# Patient Record
Sex: Male | Born: 1970 | Race: Black or African American | Hispanic: No | Marital: Single | Smoking: Current every day smoker
Health system: Southern US, Community
[De-identification: ages and names within clinical notes are randomized; demographics above are authoritative.]

---

## 2003-11-07 ENCOUNTER — Emergency Department (HOSPITAL_COMMUNITY): Admission: EM | Admit: 2003-11-07 | Discharge: 2003-11-07 | Payer: Self-pay | Admitting: Emergency Medicine

## 2005-09-20 ENCOUNTER — Emergency Department (HOSPITAL_COMMUNITY): Admission: EM | Admit: 2005-09-20 | Discharge: 2005-09-20 | Payer: Self-pay | Admitting: Emergency Medicine

## 2007-04-08 ENCOUNTER — Emergency Department (HOSPITAL_COMMUNITY): Admission: EM | Admit: 2007-04-08 | Discharge: 2007-04-08 | Payer: Self-pay | Admitting: Emergency Medicine

## 2007-04-11 ENCOUNTER — Emergency Department (HOSPITAL_COMMUNITY): Admission: EM | Admit: 2007-04-11 | Discharge: 2007-04-11 | Payer: Self-pay | Admitting: Emergency Medicine

## 2007-04-12 ENCOUNTER — Emergency Department (HOSPITAL_COMMUNITY): Admission: EM | Admit: 2007-04-12 | Discharge: 2007-04-12 | Payer: Self-pay | Admitting: Emergency Medicine

## 2007-04-15 ENCOUNTER — Emergency Department (HOSPITAL_COMMUNITY): Admission: EM | Admit: 2007-04-15 | Discharge: 2007-04-15 | Payer: Self-pay | Admitting: Emergency Medicine

## 2007-08-30 IMAGING — CR DG LUMBAR SPINE COMPLETE 4+V
6 series · 6 of 6 positions shown · non-contrast
Comparison: none

CLINICAL DATA: Back pain.
 LUMBAR SPINE ? 4 VIEWS:

[view not recorded (1 of 6)]
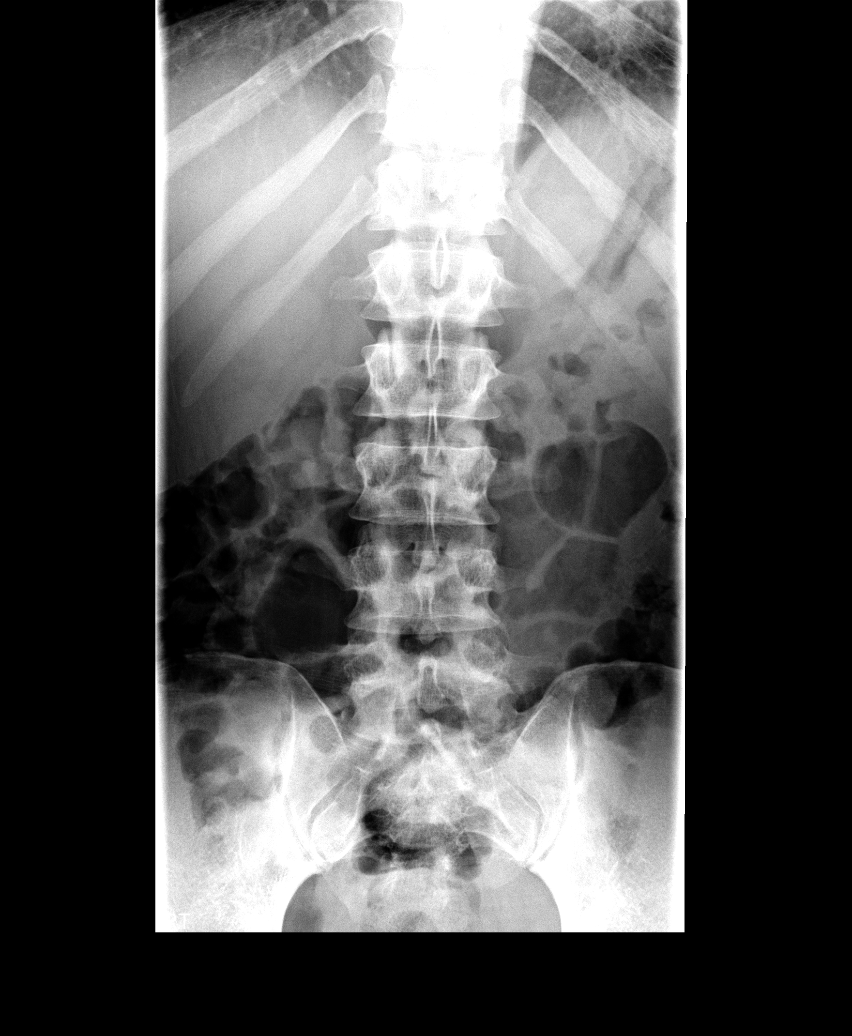

[view not recorded (2 of 6)]
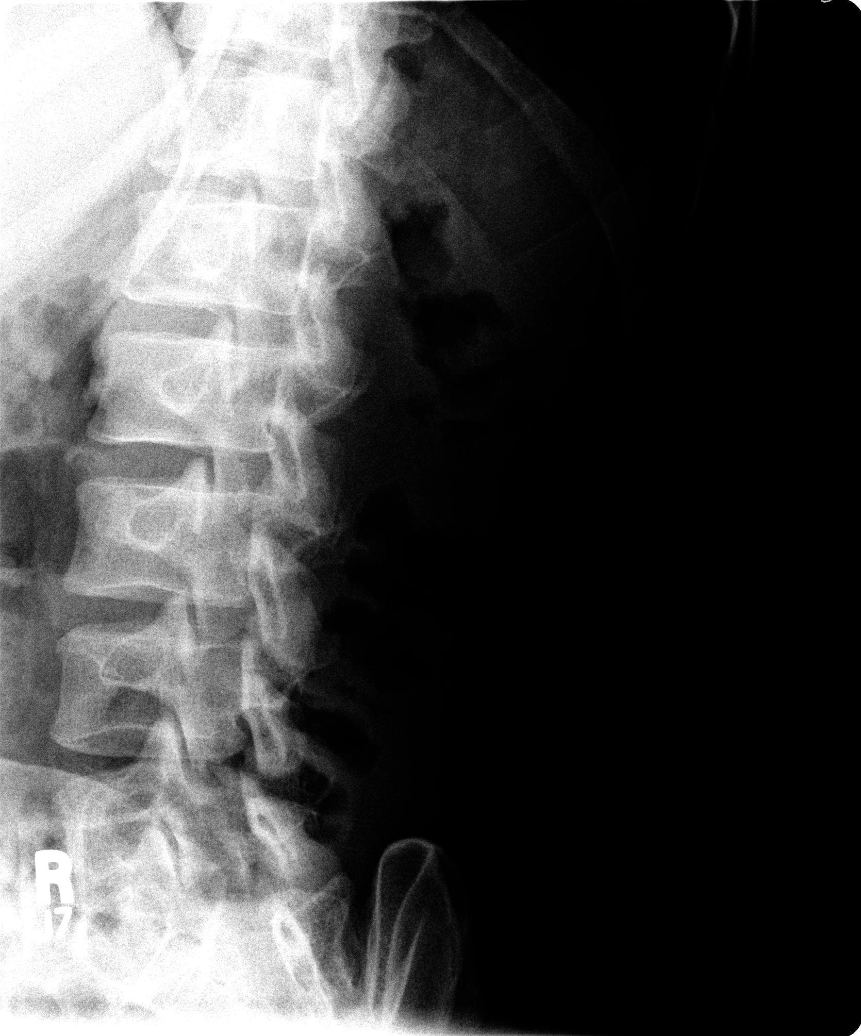

[view not recorded (3 of 6)]
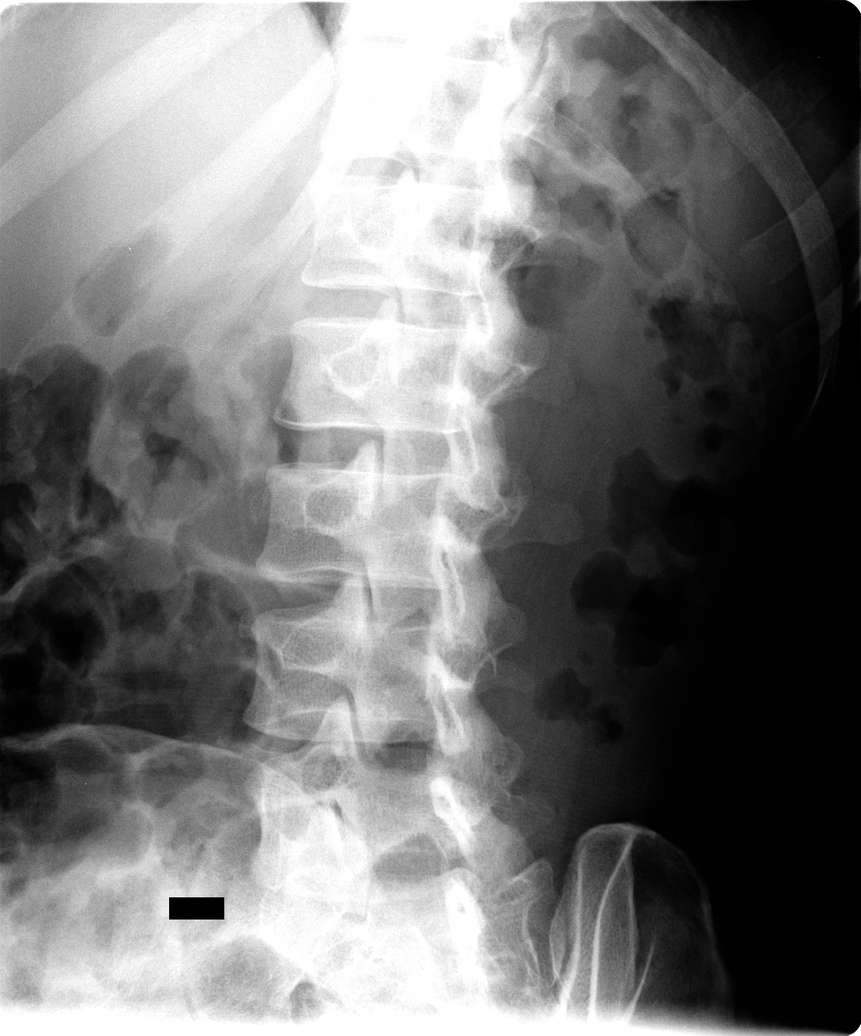

[view not recorded (4 of 6)]
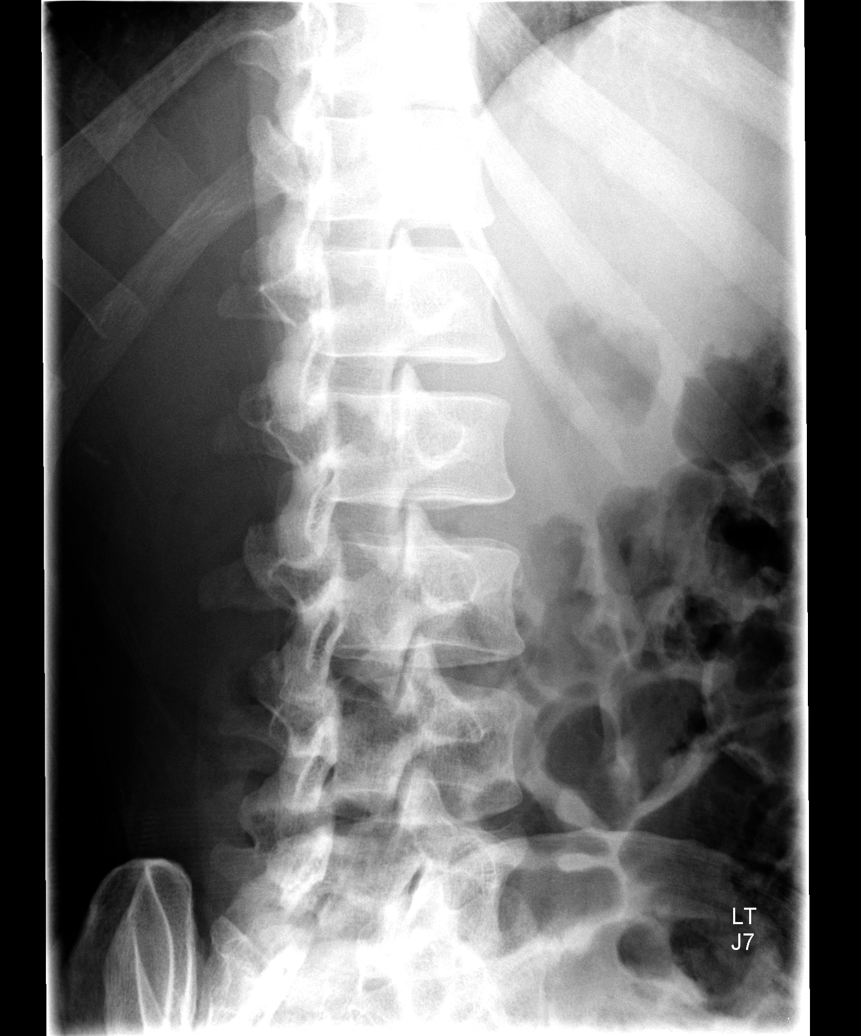

[view not recorded (5 of 6)]
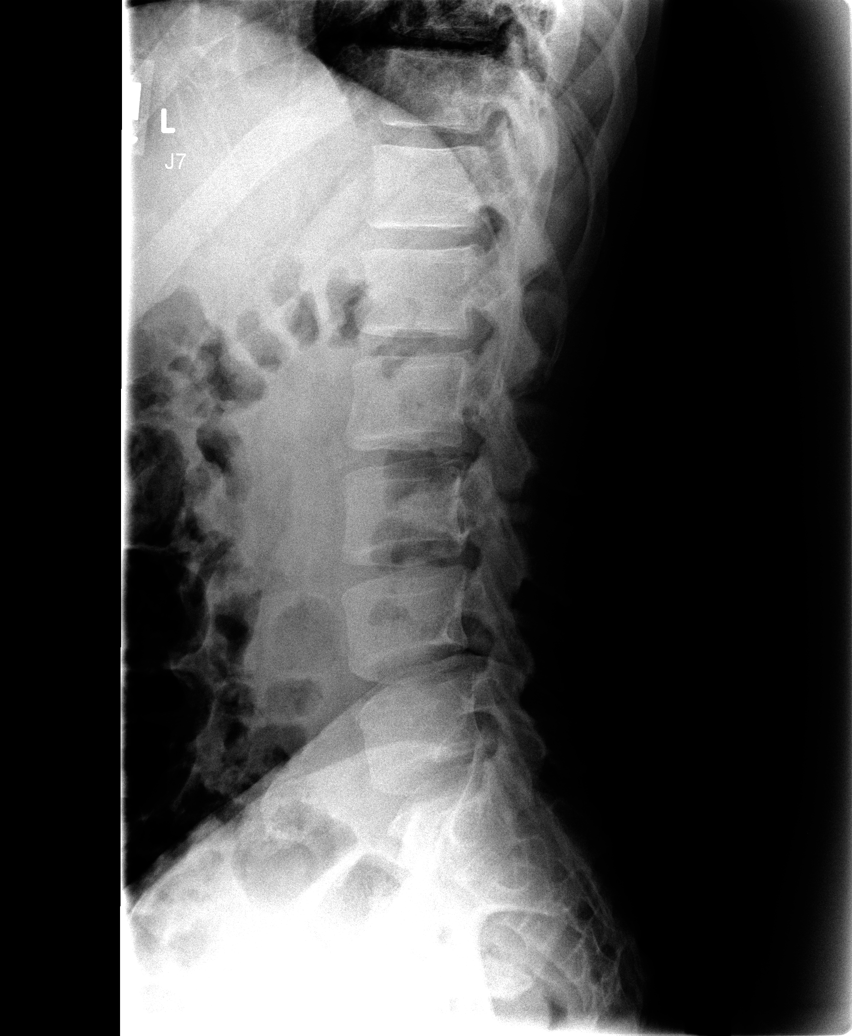

[view not recorded (6 of 6)]
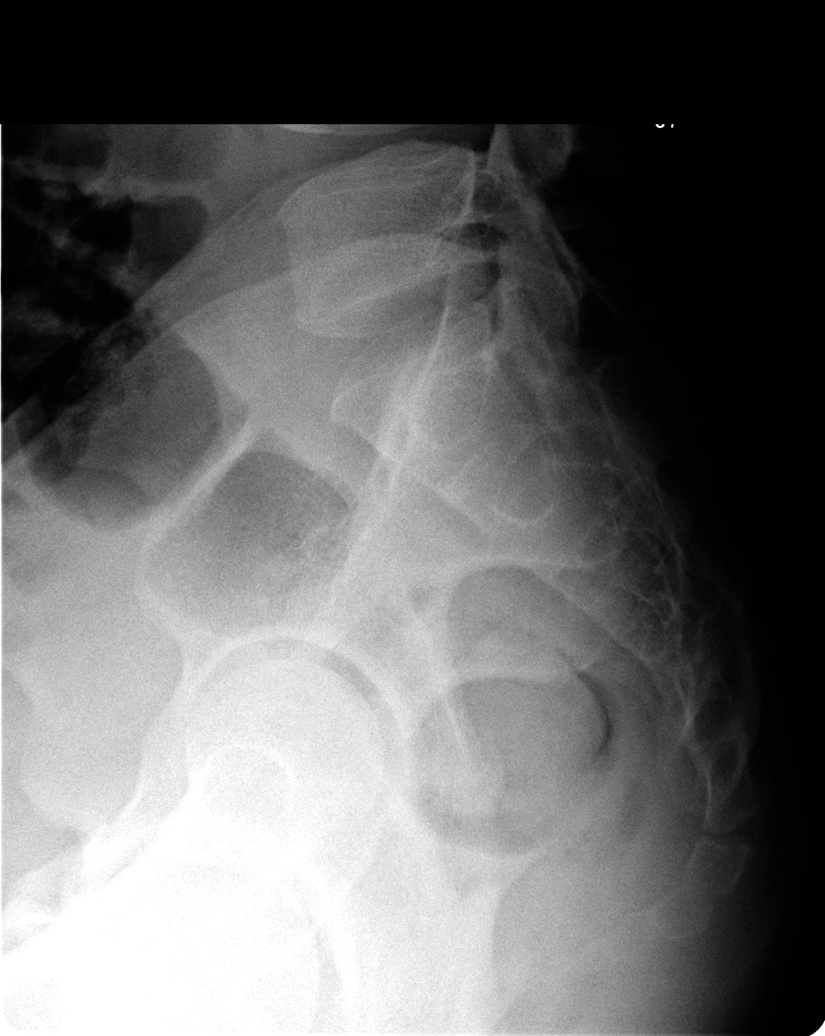

[6 of 6 positions shown; findings below may reference images not displayed]

FINDINGS: The patient has five lumbar type vertebral bodies.  Vertebral body height and alignment are maintained.  Disk space height is also maintained.  No notable facet arthropathy.
IMPRESSION: Negative exam.

## 2008-09-04 ENCOUNTER — Emergency Department (HOSPITAL_COMMUNITY): Admission: EM | Admit: 2008-09-04 | Discharge: 2008-09-04 | Payer: Self-pay | Admitting: Emergency Medicine

## 2014-09-10 ENCOUNTER — Ambulatory Visit: Payer: BLUE CROSS/BLUE SHIELD | Admitting: Neurology

## 2014-09-11 ENCOUNTER — Encounter: Payer: Self-pay | Admitting: Neurology

## 2015-05-16 ENCOUNTER — Emergency Department (HOSPITAL_COMMUNITY)
Admission: EM | Admit: 2015-05-16 | Discharge: 2015-05-16 | Disposition: A | Discharge status: Other Acute Inpatient Hospital | Payer: Self-pay | Attending: Psychiatry | Admitting: Psychiatry

## 2015-05-16 ENCOUNTER — Encounter (HOSPITAL_COMMUNITY): Payer: Self-pay | Admitting: Emergency Medicine

## 2015-05-16 DIAGNOSIS — F17210 Nicotine dependence, cigarettes, uncomplicated: Secondary | ICD-10-CM | POA: Insufficient documentation

## 2015-05-16 DIAGNOSIS — R4689 Other symptoms and signs involving appearance and behavior: Secondary | ICD-10-CM

## 2015-05-16 DIAGNOSIS — R45851 Suicidal ideations: Secondary | ICD-10-CM | POA: Insufficient documentation

## 2015-05-16 DIAGNOSIS — R4182 Altered mental status, unspecified: Secondary | ICD-10-CM | POA: Insufficient documentation

## 2015-05-16 DIAGNOSIS — R4589 Other symptoms and signs involving emotional state: Secondary | ICD-10-CM

## 2015-05-16 LAB — CBC
HEMATOCRIT: 45.9 % (ref 39.0–52.0)
Hemoglobin: 15.6 g/dL (ref 13.0–17.0)
MCH: 31 pg (ref 26.0–34.0)
MCHC: 34 g/dL (ref 30.0–36.0)
MCV: 91.3 fL (ref 78.0–100.0)
Platelets: 168 10*3/uL (ref 150–400)
RBC: 5.03 MIL/uL (ref 4.22–5.81)
RDW: 13.3 % (ref 11.5–15.5)
WBC: 4.5 10*3/uL (ref 4.0–10.5)

## 2015-05-16 LAB — RAPID URINE DRUG SCREEN, HOSP PERFORMED
Amphetamines: NOT DETECTED
BARBITURATES: NOT DETECTED
BENZODIAZEPINES: NOT DETECTED
Cocaine: NOT DETECTED
Opiates: NOT DETECTED
Tetrahydrocannabinol: NOT DETECTED

## 2015-05-16 LAB — COMPREHENSIVE METABOLIC PANEL
ALBUMIN: 4.3 g/dL (ref 3.5–5.0)
ALK PHOS: 50 U/L (ref 38–126)
ALT: 15 U/L — ABNORMAL LOW (ref 17–63)
AST: 19 U/L (ref 15–41)
Anion gap: 8 (ref 5–15)
BILIRUBIN TOTAL: 0.9 mg/dL (ref 0.3–1.2)
BUN: 8 mg/dL (ref 6–20)
CALCIUM: 9.2 mg/dL (ref 8.9–10.3)
CO2: 27 mmol/L (ref 22–32)
CREATININE: 1.01 mg/dL (ref 0.61–1.24)
Chloride: 106 mmol/L (ref 101–111)
GFR calc Af Amer: >60 mL/min (ref >60)
GLUCOSE: 90 mg/dL (ref 65–99)
Potassium: 3.4 mmol/L — ABNORMAL LOW (ref 3.5–5.1)
Sodium: 141 mmol/L (ref 135–145)
TOTAL PROTEIN: 7.3 g/dL (ref 6.5–8.1)

## 2015-05-16 LAB — ACETAMINOPHEN LEVEL: Acetaminophen (Tylenol), Serum: <10 ug/mL — ABNORMAL LOW (ref 10–30)

## 2015-05-16 LAB — ETHANOL: Alcohol, Ethyl (B): 41 mg/dL — ABNORMAL HIGH (ref <5)

## 2015-05-16 LAB — SALICYLATE LEVEL: Salicylate Lvl: <4 mg/dL (ref 2.8–30.0)

## 2015-05-16 NOTE — ED Notes (Signed)
Patient requesting water in order to use the bathroom. Gave patient ice water. Patient states "I don't want it. What you gonna do, stick a tube in my bladder? I don't care, I've been there before." Set water at table beside patient.
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
## 2015-05-16 NOTE — ED Notes (Signed)
Pt refusing lab at this time.

## 2015-05-16 NOTE — ED Notes (Signed)
telepsych in progress 

## 2015-05-16 NOTE — ED Notes (Signed)
Pt reports having to go to work in the morning. Pt became upset when notified of procedures. Pt states, I am not going to stay. States that this is pointless and we are not going to be able to stop him. States he refuses to speak to TTS.

## 2015-05-16 NOTE — ED Notes (Signed)
Per [employer] officer, IVC paperwork en route.

## 2015-05-16 NOTE — ED Notes (Signed)
[facility]. Other [employer] officer at bedside switching out handcuffs and supervision of patient.

## 2015-05-16 NOTE — ED Notes (Signed)
Patient states he is not going to allow us to collect blood and urine. Advised patient of IVC papers. Security, Emergency planning/management officerpolice officer, and phlebotomist at bedside at this time. Patient allowing phlebotomist to draw blood.

## 2015-05-16 NOTE — ED Notes (Signed)
Pt wanded by security. 

## 2015-05-16 NOTE — ED Provider Notes (Signed)
CSN: [id number]     Arrival date & time 05/16/15  1723 History   First MD Initiated Contact with Patient 05/16/15 1759     Chief Complaint  Patient presents with  . V70.1     (Consider location/radiation/quality/duration/timing/severity/associated sxs/prior Treatment) Patient is a 44 y.o. male presenting with altered mental status. The history is provided by the police (The police state that he stop this patient had a stoplight. The patient told him that he wanted to kill himself.).  Altered Mental Status Presenting symptoms: behavior changes   Severity:  Severe Most recent episode:  Today Episode history:  Unable to specify Timing:  Constant Progression:  Worsening Chronicity:  New Context: not alcohol use   Associated symptoms: no abdominal pain, no hallucinations, no headaches, no rash and no seizures     History reviewed. No pertinent past medical history. History reviewed. No pertinent past surgical history. No family history on file. Social History  Substance Use Topics  . Smoking status: Current Every Day Smoker -- 1.00 packs/day    Types: Cigarettes  . Smokeless tobacco: None  . Alcohol Use: Yes    Review of Systems  Constitutional: Negative for appetite change and fatigue.  HENT: Negative for congestion, ear discharge and sinus pressure.   Eyes: Negative for discharge.  Respiratory: Negative for cough.   Cardiovascular: Negative for chest pain.  Gastrointestinal: Negative for abdominal pain and diarrhea.  Genitourinary: Negative for frequency and hematuria.  Musculoskeletal: Negative for back pain.  Skin: Negative for rash.  Neurological: Negative for seizures and headaches.  Psychiatric/Behavioral: Negative for hallucinations.       Suicidal      Allergies  Review of patient's allergies indicates no known allergies.  Home Medications   Prior to Admission medications   Not on File   BP 172/94 mmHg  Pulse 63  Temp(Src) 98 F (36.7 C) (Oral)   Resp 18  Ht 5\' 6"  (1.676 m)  Wt 131 lb (59.421 kg)  BMI 21.15 kg/m2  SpO2 100% Physical Exam  Constitutional: He is oriented to person, place, and time. He appears well-developed.  HENT:  Head: Normocephalic.  Eyes: Conjunctivae and EOM are normal. No scleral icterus.  Neck: Neck supple. No thyromegaly present.  Cardiovascular: Normal rate and regular rhythm.  Exam reveals no gallop and no friction rub.   No murmur heard. Pulmonary/Chest: No stridor. He has no wheezes. He has no rales. He exhibits no tenderness.  Abdominal: He exhibits no distension. There is no tenderness. There is no rebound.  Musculoskeletal: Normal range of motion. He exhibits no edema.  Lymphadenopathy:    He has no cervical adenopathy.  Neurological: He is oriented to person, place, and time. He exhibits normal muscle tone. Coordination normal.  Skin: No rash noted. No erythema.  Psychiatric:  Suicidal    ED Course  Procedures (including critical care time) Labs Review Labs Reviewed  COMPREHENSIVE METABOLIC PANEL - Abnormal; Notable for the following:    Potassium 3.4 (*)    ALT 15 (*)    All other components within normal limits  ETHANOL - Abnormal; Notable for the following:    Alcohol, Ethyl (B) 41 (*)    All other components within normal limits  ACETAMINOPHEN LEVEL - Abnormal; Notable for the following:    Acetaminophen (Tylenol), Serum <10 (*)    All other components within normal limits  SALICYLATE LEVEL  CBC  URINE RAPID DRUG SCREEN, HOSP PERFORMED    Imaging Review No results found. I have  personally reviewed and evaluated these images and lab results as part of my medical decision-making.   EKG Interpretation None      MDM   Final diagnoses:  Suicidal behavior    Patient will be admitted to behavior health for suicidal ideations    Bethann Berkshire, MD 05/16/15 2107

## 2015-05-16 NOTE — ED Notes (Signed)
[employer] officer contacted contacts and IVC paperwork has not been started. ED staff initiating  IVC paperwork.

## 2015-05-16 NOTE — ED Notes (Signed)
Went in to assess patient. Pt looking at television and refusing to answer assessment questions. Right wrist handcuffed to stretcher.
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
## 2015-05-17 ENCOUNTER — Encounter (HOSPITAL_COMMUNITY): Payer: Self-pay | Admitting: Emergency Medicine

## 2015-05-17 ENCOUNTER — Inpatient Hospital Stay (HOSPITAL_COMMUNITY)
Admission: AD | Admit: 2015-05-17 | Discharge: 2015-05-21 | DRG: 885 | Disposition: A | Discharge status: Home / Self Care | Payer: No Typology Code available for payment source | Source: Intra-hospital | Attending: Psychiatry | Admitting: Psychiatry

## 2015-05-17 DIAGNOSIS — F419 Anxiety disorder, unspecified: Secondary | ICD-10-CM | POA: Diagnosis present

## 2015-05-17 DIAGNOSIS — E876 Hypokalemia: Secondary | ICD-10-CM | POA: Diagnosis present

## 2015-05-17 DIAGNOSIS — G4700 Insomnia, unspecified: Secondary | ICD-10-CM | POA: Diagnosis present

## 2015-05-17 DIAGNOSIS — F1010 Alcohol abuse, uncomplicated: Secondary | ICD-10-CM | POA: Diagnosis present

## 2015-05-17 DIAGNOSIS — F17210 Nicotine dependence, cigarettes, uncomplicated: Secondary | ICD-10-CM | POA: Diagnosis present

## 2015-05-17 DIAGNOSIS — F17200 Nicotine dependence, unspecified, uncomplicated: Secondary | ICD-10-CM | POA: Clinically undetermined

## 2015-05-17 DIAGNOSIS — F331 Major depressive disorder, recurrent, moderate: Principal | ICD-10-CM | POA: Clinically undetermined

## 2015-05-17 DIAGNOSIS — F630 Pathological gambling: Secondary | ICD-10-CM | POA: Diagnosis present

## 2015-05-17 DIAGNOSIS — R45851 Suicidal ideations: Secondary | ICD-10-CM | POA: Diagnosis present

## 2015-05-17 DIAGNOSIS — F339 Major depressive disorder, recurrent, unspecified: Secondary | ICD-10-CM | POA: Diagnosis present

## 2015-05-17 MED ORDER — MAGNESIUM HYDROXIDE 400 MG/5ML PO SUSP: 30.0000 mL | Freq: Every day | ORAL | Status: DC | PRN

## 2015-05-17 MED ORDER — ACETAMINOPHEN 325 MG PO TABS: 650.0000 mg | ORAL_TABLET | Freq: Four times a day (QID) | ORAL | Status: DC | PRN

## 2015-05-17 MED ORDER — HYDROXYZINE HCL 25 MG PO TABS
25.0000 mg | ORAL_TABLET | Freq: Three times a day (TID) | ORAL | Status: DC | PRN
  Filled 2015-05-17: qty 10

## 2015-05-17 MED ORDER — ALUM & MAG HYDROXIDE-SIMETH 200-200-20 MG/5ML PO SUSP: 30.0000 mL | ORAL | Status: DC | PRN

## 2015-05-17 MED ORDER — CITALOPRAM HYDROBROMIDE 10 MG PO TABS
10.0000 mg | ORAL_TABLET | Freq: Every day | ORAL | Status: DC
  Administered 2015-05-17 – 2015-05-21 (×5): 10 mg via ORAL
  Filled 2015-05-17 (×2): qty 1
  Filled 2015-05-17: qty 7
  Filled 2015-05-17 (×5): qty 1

## 2015-05-17 MED ORDER — POTASSIUM CHLORIDE CRYS ER 10 MEQ PO TBCR
10.0000 meq | EXTENDED_RELEASE_TABLET | Freq: Two times a day (BID) | ORAL | Status: AC
  Administered 2015-05-17 – 2015-05-19 (×4): 10 meq via ORAL
  Filled 2015-05-17 (×6): qty 1

## 2015-05-17 MED ORDER — LORAZEPAM 1 MG PO TABS: 1.0000 mg | ORAL_TABLET | ORAL | Status: DC | PRN

## 2015-05-17 NOTE — Progress Notes (Signed)
D: Pt denies SI/HI/AVH. Pt is pleasant and cooperative. Pt irritated, pt stayed in room, pt refused anythig to help relax him. Pt forwards little information. "I just said the wrong fucking thing to the wrong fucking person".   A: Pt was offered support and encouragement. Pt was encourage to attend groups. Q 15 minute checks were done for safety.   R: safety maintained on unit.

## 2015-05-17 NOTE — Progress Notes (Signed)
Patient has been isolative to room this shift.  Patient has been irritable when approached by staff and asked to be left alone.  Patient has been hesitant to be compliant with medications and has reported feeling depressed and having thoughts of suicide but no active plan in the hospital.  Assess patient for safety, offer medications as prescribed, engage patient in 1:1 staff talks,   Patient was able to contract for safety.

## 2015-05-17 NOTE — [facility] Group Notes (Signed)
[facility] Group Notes: (Clinical Social Work)   05/17/2015      Type of Therapy:  Group Therapy   Participation Level:  Did Not Attend despite MHT prompting   Ambrose MantleMareida Grossman-Orr, LCSW 05/17/2015, 12:32 PM
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
## 2015-05-17 NOTE — [facility] Suicide Risk Assessment (Addendum)
[facility]   Nursing information obtained from:    Demographic factors:    Current Mental Status:    Loss Factors:    Historical Factors:    Risk Reduction Factors:    Total Time spent with patient: 30 minutes Principal Problem: MDD (major depressive disorder), recurrent episode, moderate (HCC) Diagnosis:   Patient Active Problem List   Diagnosis Date Noted  . MDD (major depressive disorder), recurrent episode, moderate (HCC) [F33.1] 05/17/2015  . Alcohol use disorder, mild, abuse [F10.10] 05/17/2015  . Tobacco use disorder [F17.200] 05/17/2015  . Hypokalemia [E87.6] 05/17/2015     Continued Clinical Symptoms:  Alcohol Use Disorder Identification Test Final Score (AUDIT): 1 The "Alcohol Use Disorders Identification Test", Guidelines for Use in Primary Care, Second Edition.  World Science writerHealth Organization [facility](WHO). Score between 0-7:  no or low risk or alcohol related problems. Score between 8-15:  moderate risk of alcohol related problems. Score between 16-19:  high risk of alcohol related problems. Score 20 or above:  warrants further diagnostic evaluation for alcohol dependence and treatment.   CLINICAL FACTORS:   Alcohol/Substance Abuse/Dependencies   Musculoskeletal: Strength & Muscle Tone: within normal limits Gait & Station: normal Patient leans: N/A  Psychiatric Specialty Exam: Physical Exam  Review of Systems  Psychiatric/Behavioral: Positive for depression and substance abuse. The patient is nervous/anxious.   All other systems reviewed and are negative.   Blood pressure 127/79, pulse 54, temperature 97.9 F (36.6 C), temperature source Oral, resp. rate 18, height 5\' 7"  (1.702 m), weight 61.689 kg (136 lb).Body mass index is 21.3 kg/(m^2).  General Appearance: Fairly Groomed  Patent attorneyye Contact::  Poor  Speech:  Clear and Coherent  Volume:  Normal  Mood:  Anxious and Depressed  Affect:  Congruent  Thought Process:  Coherent  Orientation:   Full (Time, Place, and Person)  Thought Content:  Rumination  Suicidal Thoughts:  No  Homicidal Thoughts:  No  Memory:  Immediate;   Fair Recent;   Fair Remote;   Fair  Judgement:  Impaired  Insight:  Fair  Psychomotor Activity:  Normal  Concentration:  Fair  Recall:  [employer]is[employer]Fair  Fund of Knowledge:Fair  Language: Fair  Akathisia:  No  Handed:  Right  AIMS (if indicated):     Assets:  Communication Skills Desire for Improvement  Sleep:     Cognition: WNL  ADL's:  Intact     COGNITIVE FEATURES THAT CONTRIBUTE TO RISK:  Closed-mindedness, Polarized thinking and Thought constriction (tunnel vision)    SUICIDE RISK:   Moderate:  Frequent suicidal ideation with limited intensity, and duration, some specificity in terms of plans, no associated intent, good self-control, limited dysphoria/symptomatology, some risk factors present, and identifiable protective factors, including available and accessible social support.  PLAN OF CARE: Patient will benefit from inpatient treatment and stabilization.  Estimated length of stay is 5-7 days.  Reviewed past medical records,treatment plan.  Patient with vague irritability , SI on admission. Will start Celexa 10 mg po daily for affective sx. Will add Vistaril 25 mg po tid prn for anxiety sx. Will start CIWA/ativan protocol. Will add KDUR 10 meqx4 doses for hypokalemia. Will continue to monitor vitals ,medication compliance and treatment side effects while patient is here.  Will monitor for medical issues as well as call consult as needed.  Reviewed labs cbc- wnl, cmp - wnl, UDS- negative , BAL-41, Will get TSH.  CSW will start working on disposition.  Patient to participate in therapeutic milieu .  Medical Decision Making:  Review of Psycho-Social Stressors (1), Review or order clinical lab tests (1), Discuss test with performing physician (1), Established Problem, Worsening (2), Review of Last Therapy Session (1), Review or order  medicine tests (1) and Review of New Medication or Change in Dosage (2)  I certify that inpatient services furnished can reasonably be expected to improve the patient's condition.   Kyann Heydt md 05/17/2015, 11:41 AM

## 2015-05-17 NOTE — Plan of Care (Signed)
Problem: Ineffective individual coping Goal: STG: Patient will remain free from self harm Outcome: Progressing Pt safe on the unit at this time     
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
## 2015-05-17 NOTE — Clinical Social Work Psychosocial (Signed)
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  CSW explained that pt was in hospital and that was the reason for his absence.  The manager [name] expressed relief to know he was okay, as she had been trying to contact him.  She stated the business is closed tomorrow for the holiday, and asked that pt call her on Monday.  This message was relayed to the pt.  He will need a letter about hospitalization at discharge.  Caleb MantleMareida Grossman-Orr, LCSW 05/17/2015, 9:45 AM
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
## 2015-05-17 NOTE — BHH Suicide Risk Assessment (Signed)
BHH INPATIENT:  Family/Significant Other Suicide Prevention Education  Suicide Prevention Education:  Patient Refusal for Family/Significant Other Suicide Prevention Education: The patient Caleb Estrada has refused to provide written consent for family/significant other to be provided Family/Significant Other Suicide Prevention Education during admission and/or prior to discharge.  Physician notified.  Sarina SerGrossman-Orr, Guenther Dunshee Jo 05/17/2015, 1:22 PM

## 2015-05-17 NOTE — Tx Team (Signed)
Initial Interdisciplinary Treatment Plan   PATIENT STRESSORS: Financial difficulties Loss of dad Traumatic event   PATIENT STRENGTHS: Ability for insight Average or above average intelligence Capable of independent living Communication skills General fund of knowledge Physical Health   PROBLEM LIST: Problem List/Patient Goals Date to be addressed Date deferred Reason deferred Estimated date of resolution  "depression" 05/17/15     "Find help for gambling" 05/17/15                                                DISCHARGE CRITERIA:  Ability to meet basic life and health needs Improved stabilization in mood, thinking, and/or behavior Motivation to continue treatment in a less acute level of care  PRELIMINARY DISCHARGE PLAN: Attend aftercare/continuing care group Return to previous living arrangement Return to previous work or school arrangements  PATIENT/FAMIILY INVOLVEMENT: This treatment plan has been presented to and reviewed with the patient, Caleb Estrada.  The patient and family have been given the opportunity to ask questions and make suggestions.  Curly RimBarbara B Makaylen Thieme 05/17/2015, 4:05 AM

## 2015-05-18 NOTE — Progress Notes (Addendum)
[facility] MD Progress Note  05/18/2015 1:13 PM Caleb TERPSTRA  MRN:  [id number] Subjective:  Patient is still in room.  Isolating.  He states he does not want to be here and that he has a hard time around other people.  "Ive always been a loner."  Denies adverse medication side effects. Principal Problem: MDD (major depressive disorder), recurrent episode, moderate ([place]) Diagnosis:   Patient Active Problem List   Diagnosis Date Noted  . MDD (major depressive disorder), recurrent episode, moderate ([place]) [F33.1] 05/17/2015  . Alcohol use disorder, mild, abuse [F10.10] 05/17/2015  . Tobacco use disorder [F17.200] 05/17/2015  . Hypokalemia [E87.6] 05/17/2015   Total Time spent with patient: 30 minutes  Past Psychiatric History: MDD  Past Medical History: History reviewed. No pertinent past medical history. History reviewed. No pertinent past surgical history. Family History:  Family History  Problem Relation Age of Onset  . Mental illness Neg Hx   . Hypertension Neg Hx    Family Psychiatric  History:  negative Social History:  History  Alcohol Use  . Yes     History  Drug Use No    Social History   Social History  . Marital Status: Single    Spouse Name: N/A  . Number of Children: N/A  . Years of Education: N/A   Social History Main Topics  . Smoking status: Current Every Day Smoker -- 1.00 packs/day    Types: Cigarettes  . Smokeless tobacco: None  . Alcohol Use: Yes  . Drug Use: No  . Sexual Activity: Not Asked   Other Topics Concern  . None   Social History Narrative   Additional Social History:      Sleep: Fair  Appetite:  Fair  Current Medications: Current Facility-Administered Medications  Medication Dose Route Frequency Provider Last Rate Last Dose  . acetaminophen (TYLENOL) tablet 650 mg  650 mg Oral Q6H PRN Harriet Butte, NP      . alum & mag hydroxide-simeth (MAALOX/MYLANTA) 200-200-20 MG/5ML suspension 30 mL  30 mL Oral Q4H PRN Harriet Butte, NP       . citalopram (CELEXA) tablet 10 mg  10 mg Oral Daily Ursula Alert, MD   10 mg at 05/17/15 1328  . hydrOXYzine (ATARAX/VISTARIL) tablet 25 mg  25 mg Oral TID PRN Harriet Butte, NP      . LORazepam (ATIVAN) tablet 1 mg  1 mg Oral Q4H PRN Ursula Alert, MD      . magnesium hydroxide (MILK OF MAGNESIA) suspension 30 mL  30 mL Oral Daily PRN Harriet Butte, NP      . potassium chloride (K-DUR,KLOR-CON) CR tablet 10 mEq  10 mEq Oral BID Ursula Alert, MD   10 mEq at 05/18/15 1213    Lab Results:  Results for orders placed or performed during the hospital encounter of 05/16/15 (from the past 48 hour(s))  Comprehensive metabolic panel     Status: Abnormal   Collection Time: 05/16/15  7:26 PM  Result Value Ref Range   Sodium 141 135 - 145 mmol/L   Potassium 3.4 (L) 3.5 - 5.1 mmol/L   Chloride 106 101 - 111 mmol/L   CO2 27 22 - 32 mmol/L   Glucose, Bld 90 65 - 99 mg/dL   BUN 8 6 - 20 mg/dL   Creatinine, Ser 1.01 0.61 - 1.24 mg/dL   Calcium 9.2 8.9 - 10.3 mg/dL   Total Protein 7.3 6.5 - 8.1 g/dL   Albumin 4.3 3.5 -  5.0 g/dL   AST 19 15 - 41 U/L   ALT 15 (L) 17 - 63 U/L   Alkaline Phosphatase 50 38 - 126 U/L   Total Bilirubin 0.9 0.3 - 1.2 mg/dL   GFR calc non Af Amer >60 >60 mL/min   GFR calc Af Amer >60 >60 mL/min    Comment: (NOTE) The eGFR has been calculated using the CKD EPI equation. This calculation has not been validated in all clinical situations. eGFR's persistently <60 mL/min signify possible Chronic Kidney Disease.    Anion gap 8 5 - 15  Ethanol (ETOH)     Status: Abnormal   Collection Time: 05/16/15  7:26 PM  Result Value Ref Range   Alcohol, Ethyl (B) 41 (H) <5 mg/dL    Comment:        LOWEST DETECTABLE LIMIT FOR SERUM ALCOHOL IS 5 mg/dL FOR MEDICAL PURPOSES ONLY   Salicylate level     Status: None   Collection Time: 05/16/15  7:26 PM  Result Value Ref Range   Salicylate Lvl <9.8 2.8 - 30.0 mg/dL  Acetaminophen level     Status: Abnormal   Collection  Time: 05/16/15  7:26 PM  Result Value Ref Range   Acetaminophen (Tylenol), Serum <10 (L) 10 - 30 ug/mL    Comment:        THERAPEUTIC CONCENTRATIONS VARY SIGNIFICANTLY. A RANGE OF 10-30 ug/mL MAY BE AN EFFECTIVE CONCENTRATION FOR MANY PATIENTS. HOWEVER, SOME ARE BEST TREATED AT CONCENTRATIONS OUTSIDE THIS RANGE. ACETAMINOPHEN CONCENTRATIONS >150 ug/mL AT 4 HOURS AFTER INGESTION AND >50 ug/mL AT 12 HOURS AFTER INGESTION ARE OFTEN ASSOCIATED WITH TOXIC REACTIONS.   CBC     Status: None   Collection Time: 05/16/15  7:26 PM  Result Value Ref Range   WBC 4.5 4.0 - 10.5 K/uL   RBC 5.03 4.22 - 5.81 MIL/uL   Hemoglobin 15.6 13.0 - 17.0 g/dL   HCT 45.9 39.0 - 52.0 %   MCV 91.3 78.0 - 100.0 fL   MCH 31.0 26.0 - 34.0 pg   MCHC 34.0 30.0 - 36.0 g/dL   RDW 13.3 11.5 - 15.5 %   Platelets 168 150 - 400 K/uL  Urine rapid drug screen (hosp performed) (Not at [facility])     Status: None   Collection Time: 05/16/15  9:26 PM  Result Value Ref Range   Opiates NONE DETECTED NONE DETECTED   Cocaine NONE DETECTED NONE DETECTED   Benzodiazepines NONE DETECTED NONE DETECTED   Amphetamines NONE DETECTED NONE DETECTED   Tetrahydrocannabinol NONE DETECTED NONE DETECTED   Barbiturates NONE DETECTED NONE DETECTED    Comment:        DRUG SCREEN FOR MEDICAL PURPOSES ONLY.  IF CONFIRMATION IS NEEDED FOR ANY PURPOSE, NOTIFY LAB WITHIN 5 DAYS.        LOWEST DETECTABLE LIMITS FOR URINE DRUG SCREEN Drug Class       Cutoff (ng/mL) Amphetamine      1000 Barbiturate      200 Benzodiazepine   338 Tricyclics       250 Opiates          300 Cocaine          300 THC              50     Physical Findings: AIMS:  , ,  ,  ,    CIWA:  CIWA-Ar Total: 0 COWS:     Musculoskeletal: Strength & Muscle Tone: within normal limits Gait &  Station: normal Patient leans: N/A  Psychiatric Specialty Exam: Review of Systems  All other systems reviewed and are negative.   Blood pressure 127/79, pulse 54,  temperature 97.9 F (36.6 C), temperature source Oral, resp. rate 18, height 5' 7"  (1.702 m), weight 61.689 kg (136 lb).Body mass index is 21.3 kg/(m^2).   General Appearance: Fairly Groomed  Engineer, water:: Poor  Speech: Clear and Coherent  Volume: Normal  Mood: Anxious and Depressed  Affect: Congruent  Thought Process: Coherent  Orientation: Full (Time, Place, and Person)  Thought Content: Rumination  Suicidal Thoughts: No  Homicidal Thoughts: No  Memory: Immediate; Fair Recent; Fair Remote; Fair  Judgement: Impaired  Insight: Fair  Psychomotor Activity: Normal  Concentration: Fair  Recall: [employer] of Knowledge:Fair  Language: Fair  Akathisia: No  Handed: Right  AIMS (if indicated):   Assets: Communication Skills Desire for Improvement  Sleep:   Cognition: WNL  ADL's: Intact           Treatment Plan Summary: Daily contact with patient to assess and evaluate symptoms and progress in treatment and Medication management See orders  Freda Munro May Koya Hunger AGNP-BC 05/18/2015, 1:13 PM

## 2015-05-19 MED ORDER — NALTREXONE HCL 50 MG PO TABS
50.0000 mg | ORAL_TABLET | Freq: Every day | ORAL | Status: DC
  Administered 2015-05-19 – 2015-05-21 (×3): 50 mg via ORAL
  Filled 2015-05-19 (×3): qty 1
  Filled 2015-05-19: qty 7
  Filled 2015-05-19 (×2): qty 1

## 2015-05-19 NOTE — Progress Notes (Signed)
Patient ID: Caleb Estrada, male   DOB: [birth year], 44 y.o.   MRN: [id number] [facility] MD Progress Note  05/19/2015 3:24 PM Caleb Estrada  MRN:  [id number] Subjective:  Patient reports he is feeling partially better, less depressed, and he is more focused on " figuring out when I am going to discharge, because I need to go back to work". Denies medication side effects. Reports " I  Know that my main problem is gambling". Describes pathological gambling, reports  compulsively gambling even when having no money for it, losing thousands of dollars and important relationships due to gambling, and not being able to stop in spite of wanting to do so. States gambling contributes significantly to his depression. Objective : I have reviewed chart notes and met with patient. At this time reports partial improvement , but still feels slightly depressed . Denies any SI and contracts for safety on the unit. Denies any psychotic symptoms. Thus far tolerating Celexa trial well. Limited group participation - states " I have always been a loner", and admits he prefers to keep to self . At this time focusing on being discharged soon in order to return to work .  Principal Problem: MDD (major depressive disorder), recurrent episode, moderate ([place]) Diagnosis:   Patient Active Problem List   Diagnosis Date Noted  . MDD (major depressive disorder), recurrent episode, moderate ([place]) [F33.1] 05/17/2015  . Alcohol use disorder, mild, abuse [F10.10] 05/17/2015  . Tobacco use disorder [F17.200] 05/17/2015  . Hypokalemia [E87.6] 05/17/2015   Total Time spent with patient: 25 minutes   Past Psychiatric History: MDD  Past Medical History: History reviewed. No pertinent past medical history. History reviewed. No pertinent past surgical history. Family History:  Family History  Problem Relation Age of Onset  . Mental illness Neg Hx   . Hypertension Neg Hx    Family Psychiatric  History:  negative Social History:   History  Alcohol Use  . Yes     History  Drug Use No    Social History   Social History  . Marital Status: Single    Spouse Name: N/A  . Number of Children: N/A  . Years of Education: N/A   Social History Main Topics  . Smoking status: Current Every Day Smoker -- 1.00 packs/day    Types: Cigarettes  . Smokeless tobacco: None  . Alcohol Use: Yes  . Drug Use: No  . Sexual Activity: Not Asked   Other Topics Concern  . None   Social History Narrative   Additional Social History:      Sleep: improved   Appetite:   Improved   Current Medications: Current Facility-Administered Medications  Medication Dose Route Frequency Provider Last Rate Last Dose  . acetaminophen (TYLENOL) tablet 650 mg  650 mg Oral Q6H PRN Harriet Butte, NP      . alum & mag hydroxide-simeth (MAALOX/MYLANTA) 200-200-20 MG/5ML suspension 30 mL  30 mL Oral Q4H PRN Harriet Butte, NP      . citalopram (CELEXA) tablet 10 mg  10 mg Oral Daily Ursula Alert, MD   10 mg at 05/19/15 0813  . hydrOXYzine (ATARAX/VISTARIL) tablet 25 mg  25 mg Oral TID PRN Harriet Butte, NP      . LORazepam (ATIVAN) tablet 1 mg  1 mg Oral Q4H PRN Saramma Eappen, MD      . magnesium hydroxide (MILK OF MAGNESIA) suspension 30 mL  30 mL Oral Daily PRN Harriet Butte, NP  Lab Results:  No results found for this or any previous visit (from the past 48 hour(s)).  Physical Findings: AIMS:  , ,  ,  ,    CIWA:  CIWA-Ar Total: 0 COWS:     Musculoskeletal: Strength & Muscle Tone: within normal limits Gait & Station: normal Patient leans: N/A  Psychiatric Specialty Exam: Review of Systems  All other systems reviewed and are negative. denies headache, denies chest pain, denies SOB, no vomiting   Blood pressure 125/71, pulse 56, temperature 98.2 F (36.8 C), temperature source Oral, resp. rate 16, height 5' 7"  (1.702 m), weight 136 lb (61.689 kg).Body mass index is 21.3 kg/(m^2).   General Appearance: Fairly  Groomed  Engineer, water:: improved   Speech: Clear and Coherent  Volume: Normal  Mood: states mood is improved, less depressed   Affect: reactive, smiles at times appropriately  Thought Process:  Linear   Orientation: Full (Time, Place, and Person)  Thought Content: denies hallucinations, no delusions   Suicidal Thoughts: Noat this time denies any SI and contracts for safety on the unit   Homicidal Thoughts: No  Memory:recent and remote grossly intact   Judgement: improving  Insight: improving   Psychomotor Activity: Normal  Concentration: good   Recall: good   Fund of Knowledge:good   Language: good   Akathisia: No  Handed: Right  AIMS (if indicated):   Assets: Communication Skills Desire for Improvement  Sleep:   Cognition: WNL  ADL's: Intact          Assessment - patient reports improving mood and is less depressed. No SI at this time and more future oriented. Tolerating Celexa trial well thus far. Reports history of excessive , pathological gambling which has resulted in severe psychosocial and monetary losses which in turn have contributed to his depression. He is insightful about this and states he wants to stop- describes cravings to gamble as a factor in perpetuating this behavior . We discussed Naltrexone as an option - patient not on any narcotics- we reviewed off label use and possible side effects.  Treatment Plan Summary: Daily contact with patient to assess and evaluate symptoms and progress in treatment and Medication management Encourage patient to attend groups and milieu to work on coping skills and symptom reduction Start Naltrexone 50 mgrs QDAY for compulsive , addictive gambling behaviors Continue Celexa 10 mgrs QDAY for depression Continue Vistaril 25 mgrs Q 6 hours PRN for anxiety as needed  Treatment team working on disposition planning   Neita Garnet , MD   05/19/2015, 3:24 PM

## 2015-05-19 NOTE — Plan of Care (Signed)
Problem: Ineffective individual coping Goal: STG: Patient will remain free from self harm Outcome: Progressing Pt safe on the unit     

## 2015-05-19 NOTE — Progress Notes (Signed)
D: Pt denies SI/HI/AVH. Pt stated he was the same. Pt continues to be irritated, but pt seen in dayroom watching TV.   A: Pt was offered support and encouragement. Pt was given scheduled medications. Pt was encourage to attend groups. Q 15 minute checks were done for safety.   R:Pt attends groups and interacts well with peers and staff. Pt is taking medication. Pt receptive to treatment and safety maintained on unit.

## 2015-05-19 NOTE — Progress Notes (Signed)
D: Pt denies SI/HI/AVH. Pt continues to keep to himself. Pt seen watching TV in dayroom this evening. Pt still avoidant of writer, but pt will talk and answer questions if prompted.   A: Pt was offered support and encouragement. Pt was given scheduled medications. Pt was encourage to attend groups. Q 15 minute checks were done for safety.   R:Pt attends groups and interacts well with peers and staff. Pt is taking medication. Pt receptive to treatment and safety maintained on unit.

## 2015-05-19 NOTE — Progress Notes (Signed)
Recreation Therapy Notes  Date: 12.26.2016 Time: 9:30am  Location: [address] Dayroom   Group Topic: Stress Management  Goal Area(s) Addresses:  Patient will actively participate in stress management techniques presented during session.   Behavioral Response: Did not attend.   Ludwin Flahive L Chenell Lozon, LRT/CTRS         Adriene Knipfer L 05/19/2015 12:10 PM 

## 2015-05-19 NOTE — Plan of Care (Signed)
Problem: Diagnosis: Increased Risk For Suicide Attempt Goal: LTG-Patient Will Report Improved Mood and Deny Suicidal LTG (by discharge) Patient will report improved mood and deny suicidal ideation.  Outcome: Progressing Pt reported feeling better and denied SI

## 2015-05-19 NOTE — Progress Notes (Signed)
Pt present with depressed and flat affect. Pt has been the most of this morning although came out for his medications. Pt denied any physical pain. As per self inventory, pt had a fair night sleep, fair appetite, normal energy, and good concentration. Pt rated his anxiety at a 0, depression at a 0, and hopelessness at a 0. Pt's safety ensured with 15 minute and environmental checks. Pt currently denies SI/HI and A/V hallucinations. Pt verbally agrees to seek staff if SI/HI or A/VH occurs and to consult with staff before acting on these thoughts. Will continue POC.

## 2015-05-19 NOTE — Progress Notes (Signed)
Adult Psychoeducational Group Note  Date:  05/19/2015 Time:  11:18 PM  Group Topic/Focus:  Wrap-Up Group:   The focus of this group is to help patients review their daily goal of treatment and discuss progress on daily workbooks.  Participation Level:  Active  Participation Quality:  Appropriate and Attentive  Affect:  Appropriate  Cognitive:  Alert, Appropriate and Oriented  Insight: Appropriate  Engagement in Group:  Engaged  Modes of Intervention:  Discussion and Education  Additional Comments:  Pt attended and participated in group.  Pt stated he had a good day today and is prepared for discharge tomorrow.   Berlin Hunuttle, Jeniyah Menor M 05/19/2015, 11:18 PM

## 2015-05-19 NOTE — Plan of Care (Signed)
Problem: Ineffective individual coping Goal: STG: Patient will remain free from self harm Outcome: Progressing Pt safe on the unit at this time     

## 2015-05-19 NOTE — [facility] Group Notes (Signed)
[facility]  05/19/2015  8:45 AM  Participation Quality: Did Not Attend. Patient invited to participate but declined.  Samuella BruinKristin Renwick Asman, MSW, [employer]CSWA Clinical Social Worker [facility] [phone]

## 2015-05-19 NOTE — Progress Notes (Signed)
Pt is in his room sleeping. Respirations are even and unlabored. Offered 15 minute checks. Environment secured. Safety maintained on the unit.

## 2015-05-20 NOTE — Plan of Care (Signed)
Problem: Diagnosis: Increased Risk For Suicide Attempt Goal: STG-Patient Will Comply With Medication Regime Outcome: Progressing Caleb Estrada took his morning medications with no distress

## 2015-05-20 NOTE — Progress Notes (Signed)
Adult Psychoeducational Group Note  Date:  05/20/2015 Time:  11:51 PM  Group Topic/Focus:  Wrap-Up Group:   The focus of this group is to help patients review their daily goal of treatment and discuss progress on daily workbooks.  Participation Level:  Active  Participation Quality:  Appropriate and Attentive  Affect:  Appropriate  Cognitive:  Alert, Appropriate and Oriented  Insight: Appropriate  Engagement in Group:  Engaged  Modes of Intervention:  Discussion and Education  Additional Comments:  Pt attended and participated in group. Pt reported that he had a pretty good day and stated that he will be discharging in the morning.  Pt rated his day a 7/10 with 10 being the best.   Berlin Hunuttle, Ariyona Eid M 05/20/2015, 11:51 PM

## 2015-05-20 NOTE — [facility] Group Notes (Signed)
[facility] Group Notes:  (Nursing/MHT/Case Management/Adjunct)  Date:  05/20/2015  Time:  11:11 AM  Type of Therapy:  Psychoeducational Skills  Participation Level:  Did Not Attend  Participation Quality:  N/A  Affect:  N/A   Cognitive:  N/A  Insight:  None  Engagement in Group:  None  Modes of Intervention:  Discussion and Education  Summary of Progress/Problems: Patient was invited to group but did not attend.   Caleb Estrada E 05/20/2015, 11:11 AM 

## 2015-05-20 NOTE — Progress Notes (Signed)
Patient ID: Caleb Estrada, male   DOB: December 30, 1970, 44 y.o.   MRN: [id number] [facility] MD Progress Note  05/20/2015 5:19 PM SAMARTH OGLE  MRN:  [id number] Subjective:  Patient reports partial improvement compared to admission. Thus far tolerating medications well. He is on Celexa and was also  started on Naltrexone yesterday to address history of pathological gambling . Objective : I have reviewed chart notes and met with patient. Patient reporting gradual improvement . No disruptive behaviors on unit. Limited group participation, but more visible on unit at this time.  No medication side effects .  Principal Problem: MDD (major depressive disorder), recurrent episode, moderate ([place]) Diagnosis:   Patient Active Problem List   Diagnosis Date Noted  . MDD (major depressive disorder), recurrent episode, moderate ([place]) [F33.1] 05/17/2015  . Alcohol use disorder, mild, abuse [F10.10] 05/17/2015  . Tobacco use disorder [F17.200] 05/17/2015  . Hypokalemia [E87.6] 05/17/2015   Total Time spent with patient: 20 minutes   Past Psychiatric History: MDD  Past Medical History: History reviewed. No pertinent past medical history. History reviewed. No pertinent past surgical history. Family History:  Family History  Problem Relation Age of Onset  . Mental illness Neg Hx   . Hypertension Neg Hx    Family Psychiatric  History:  negative Social History:  History  Alcohol Use  . Yes     History  Drug Use No    Social History   Social History  . Marital Status: Single    Spouse Name: N/A  . Number of Children: N/A  . Years of Education: N/A   Social History Main Topics  . Smoking status: Current Every Day Smoker -- 1.00 packs/day    Types: Cigarettes  . Smokeless tobacco: None  . Alcohol Use: Yes  . Drug Use: No  . Sexual Activity: Not Asked   Other Topics Concern  . None   Social History Narrative   Additional Social History:      Sleep: improved   Appetite:   Improved    Current Medications: Current Facility-Administered Medications  Medication Dose Route Frequency Provider Last Rate Last Dose  . acetaminophen (TYLENOL) tablet 650 mg  650 mg Oral Q6H PRN Harriet Butte, NP      . alum & mag hydroxide-simeth (MAALOX/MYLANTA) 200-200-20 MG/5ML suspension 30 mL  30 mL Oral Q4H PRN Harriet Butte, NP      . citalopram (CELEXA) tablet 10 mg  10 mg Oral Daily Ursula Alert, MD   10 mg at 05/20/15 0827  . hydrOXYzine (ATARAX/VISTARIL) tablet 25 mg  25 mg Oral TID PRN Harriet Butte, NP      . LORazepam (ATIVAN) tablet 1 mg  1 mg Oral Q4H PRN Saramma Eappen, MD      . magnesium hydroxide (MILK OF MAGNESIA) suspension 30 mL  30 mL Oral Daily PRN Harriet Butte, NP      . naltrexone (DEPADE) tablet 50 mg  50 mg Oral Daily Jenne Campus, MD   50 mg at 05/20/15 0827    Lab Results:  No results found for this or any previous visit (from the past 59 hour(s)).  Physical Findings: AIMS:  , ,  ,  ,    CIWA:  CIWA-Ar Total: 0 COWS:     Musculoskeletal: Strength & Muscle Tone: within normal limits Gait & Station: normal Patient leans: N/A  Psychiatric Specialty Exam: Review of Systems  All other systems reviewed and are negative. denies headache, denies  chest pain, denies SOB, no vomiting   Blood pressure 132/84, pulse 61, temperature 98.5 F (36.9 C), temperature source Oral, resp. rate 18, height 5' 7"  (1.702 m), weight 136 lb (61.689 kg).Body mass index is 21.3 kg/(m^2).   General Appearance: improved grooming   Eye Contact:: improved   Speech: Clear and Coherent  Volume: Normal  Mood: gradually improving, less depressed   Affect: reactive  Thought Process:  Linear   Orientation: Full (Time, Place, and Person)  Thought Content: denies hallucinations, no delusions   Suicidal Thoughts: Noat this time denies any SI and contracts for safety on the unit   Homicidal Thoughts: No  Memory:recent and remote grossly intact    Judgement: improving  Insight: improving   Psychomotor Activity: Normal  Concentration: good   Recall: good   Fund of Knowledge:good   Language: good   Akathisia: No  Handed: Right  AIMS (if indicated):   Assets: Communication Skills Desire for Improvement  Sleep:   Cognition: WNL  ADL's: Intact          Assessment - mood and affect improved compared to admission. Still reserved, isolative, but to a lesser degree than on admission. Behavior on unit calm and in good control. Tolerating medications well, to include Naltrexone trial .   Treatment Plan Summary: Daily contact with patient to assess and evaluate symptoms and progress in treatment and Medication management Encourage patient to attend groups and milieu to work on coping skills and symptom reduction Continue  Naltrexone 50 mgrs QDAY for compulsive , addictive gambling behaviors -we reviewed side effect profile and rationale again, to include opiate blocking properties - Continue Celexa 10 mgrs QDAY for depression Continue Vistaril 25 mgrs Q 6 hours PRN for anxiety as needed  Treatment team working on disposition planning   Neita Garnet , MD   05/20/2015, 5:19 PM

## 2015-05-20 NOTE — Progress Notes (Signed)
Patient ID: Caleb Estrada, male   DOB: [birth year], 44 y.o.   MRN: [id number] D: Patient in dayroom covered with blanket on approach. Pt reports he is doing "okay". Pt appeared guarded and not interacting with peers. Pt denies SI/HI/AVH and pain. Pt attended  evening wrap up group. Cooperative with assessment.  A: Met with pt 1:1. Support and encouragement provided to engage in milieu. Pt encouraged to discuss feelings and come to staff with any question or concerns.  R: Patient remains safe.

## 2015-05-20 NOTE — Plan of Care (Signed)
Problem: Diagnosis: Increased Risk For Suicide Attempt Goal: STG-Patient Will Attend All Groups On The Unit Outcome: Progressing Pt attended evening wrap up group     

## 2015-05-20 NOTE — Progress Notes (Signed)
Patient ID: Caleb Estrada, male   DOB: June 10, 1970, 44 y.o.   MRN: [id number]  DAR: Pt. Denies SI/HI and A/V Hallucinations. He reports sleep is fair, appetite is fair, energy level is normal, and concentration is good. He rates depression, anxiety, and hopelessness 0/10. Patient does not report any pain or discomfort at this time. Support and encouragement provided to the patient. Scheduled medications administered to patient per physician's orders. Patient is minimal and guarded but able to make his needs known. Q15 minute checks are maintained for safety.

## 2015-05-20 NOTE — Tx Team (Signed)
Interdisciplinary Treatment Plan Update (Adult) Date: 05/20/2015   Date: 05/20/2015 1:19 PM  Progress in Treatment:  Attending groups: No Participating in groups: No Taking medication as prescribed: Yes  Tolerating medication: Yes  Family/Significant othe contact made: No, Pt declines Patient understands diagnosis: No, Pt refuses aftercare referrals  Discussing patient identified problems/goals with staff: Yes  Medical problems stabilized or resolved: Yes  Denies suicidal/homicidal ideation: Yes currently, but on admission Pt reports that he will kill himself when he leaves Patient has not harmed self or Others: Yes   New problem(s) identified: None identified at this time.   Discharge Plan or Barriers: CSW will assess for appropriate discharge plan and relevant barriers.   Additional comments:  Patient and CSW reviewed pt's identified goals and treatment plan. Patient verbalized understanding and agreed to treatment plan. CSW reviewed [facility] "Discharge Process and Patient Involvement" Form. Pt verbalized understanding of information provided and signed form.   Reason for Continuation of Hospitalization:  Depression Medication stabilization Suicidal ideation Withdrawal symptoms   Estimated length of stay: 3-5 days  Review of initial/current patient goals per problem list:   1.  Goal(s): Patient will participate in aftercare plan  Met:  No  Target date: 3-5 days from date of admission   As evidenced by: Patient will participate within aftercare plan AEB aftercare provider and housing plan at discharge being identified.  05/20/15: CSW to work with Pt to assess for appropriate discharge plan and faciliate appointments and referrals as needed prior to d/c.  2.  Goal (s): Patient will exhibit decreased depressive symptoms and suicidal ideations.  Met:  No  Target date: 3-5 days from date of admission   As evidenced by: Patient will utilize self rating of depression at 3 or  below and demonstrate decreased signs of depression or be deemed stable for discharge by MD.  05/20/15: Pt does not participate in programming; observed to be irritable with flat affect; denies SI  4.  Goal(s): Patient will demonstrate decreased signs of withdrawal due to substance abuse  Met:  Yes  Target date: 3-5 days from date of admission   As evidenced by: Patient will produce a CIWA/COWS score of 0, have stable vitals signs, and no symptoms of withdrawal  05/20/15: Pt CIWA score of 0. Pt denies symptoms of withdrawal. Attendees:  Patient:    Family:    Physician: Dr. Parke Poisson, MD  05/20/2015 1:19 PM  Nursing: Lars Pinks, RN Case manager  05/20/2015 1:19 PM  Clinical Social Worker Peri Maris, [place] 05/20/2015 1:19 PM  Other: Tilden Fossa, LCSWA 05/20/2015 1:19 PM  Clinical: Manuella Ghazi, RN; Gaylan Gerold, RN 05/20/2015 1:19 PM  Other: , RN Charge Nurse 05/20/2015 1:19 PM  Other:    Peri Maris, [place] Clinical Social Work [phone]

## 2015-05-20 NOTE — [facility] Group Notes (Signed)
[facility] LCSW Group Therapy 05/20/2015 1:15 PM  Type of Therapy: Group Therapy- Feelings about Diagnosis  Participation Level: Minimal  Participation Quality:  Reserved  Affect:  Appropriate  Cognitive: Alert and Oriented   Insight:  Developing   Engagement in Therapy: Developing/Improving and Engaged   Modes of Intervention: Clarification, Confrontation, Discussion, Education, Exploration, Limit-setting, Orientation, Problem-solving, Rapport Building, Dance movement psychotherapisteality Testing, Socialization and Support  Description of Group:   This group will allow patients to explore their thoughts and feelings about diagnoses they have received. Patients will be guided to explore their level of understanding and acceptance of these diagnoses. Facilitator will encourage patients to process their thoughts and feelings about the reactions of others to their diagnosis, and will guide patients in identifying ways to discuss their diagnosis with significant others in their lives. This group will be process-oriented, with patients participating in exploration of their own experiences as well as giving and receiving support and challenge from other group members.  Summary of Progress/Problems:  Pt participated minimally in group discussion. He identified limited support as a trigger for his mental illness. However Pt describes feeling resilient, even in difficult circumstances.  Therapeutic Modalities:   Cognitive Behavioral Therapy Solution Focused Therapy Motivational Interviewing Relapse Prevention Therapy  Chad CordialLauren Carter, LCSWA 05/20/2015 4:23 PM

## 2015-05-21 MED ORDER — NICOTINE 21 MG/24HR TD PT24
21.0000 mg | MEDICATED_PATCH | Freq: Every day | TRANSDERMAL | Status: DC
  Filled 2015-05-21 (×3): qty 1

## 2015-05-21 MED ORDER — NALTREXONE HCL 50 MG PO TABS: 50.0000 mg | ORAL_TABLET | Freq: Every day | ORAL | Status: AC

## 2015-05-21 MED ORDER — NICOTINE 21 MG/24HR TD PT24: 21.0000 mg | MEDICATED_PATCH | Freq: Every day | TRANSDERMAL | Status: AC

## 2015-05-21 MED ORDER — CITALOPRAM HYDROBROMIDE 10 MG PO TABS: 10.0000 mg | ORAL_TABLET | Freq: Every day | ORAL | Status: AC

## 2015-05-21 MED ORDER — HYDROXYZINE HCL 25 MG PO TABS: 25.0000 mg | ORAL_TABLET | Freq: Three times a day (TID) | ORAL | Status: AC | PRN

## 2015-05-21 NOTE — Discharge Summary (Signed)
Physician Discharge Summary Note  Patient:  Caleb Estrada is an 44 y.o., male MRN:  [id number] DOB:  1971-03-28 Patient phone:  [phone] (home)  Patient address:   [address] [place] [id number],  Total Time spent with patient: Greater than 30 minutes  Date of Admission:  05/17/2015 Date of Discharge: 05-21-15  Reason for Admission: Suicidal threats with unspecific plans/intent.   Principal Problem: MDD (major depressive disorder), recurrent episode, moderate (HCC)  Discharge Diagnoses: Patient Active Problem List   Diagnosis Date Noted  . MDD (major depressive disorder), recurrent episode, moderate (HCC) [F33.1] 05/17/2015  . Alcohol use disorder, mild, abuse [F10.10] 05/17/2015  . Tobacco use disorder [F17.200] 05/17/2015  . Hypokalemia [E87.6] 05/17/2015   Past Psychiatric History: Major depression, Substance abuse  Past Medical History: History reviewed. No pertinent past medical history. History reviewed. No pertinent past surgical history.  Family History:  Family History  Problem Relation Age of Onset  . Mental illness Neg Hx   . Hypertension Neg Hx    Family Psychiatric  History: See H&P  Social History:  History  Alcohol Use  . Yes     History  Drug Use No    Social History   Social History  . Marital Status: Single    Spouse Name: N/A  . Number of Children: N/A  . Years of Education: N/A   Social History Main Topics  . Smoking status: Current Every Day Smoker -- 1.00 packs/day    Types: Cigarettes  . Smokeless tobacco: None  . Alcohol Use: Yes  . Drug Use: No  . Sexual Activity: Not Asked   Other Topics Concern  . None   Social History Narrative   Caleb Course:  Caleb Majesticravis L Taylor is an 44 y.o. male. Pt brought in by [place] [employer] after traffic stop. Pt reported SI during traffic stop, then became angry when brought to [facility]. Pt initially refused to talk during TTS assessment but then did complete the assessment. Pt reports he has  "lost everything": his father died over Thanksgiving "my only friend", he reports he has a gambling problem and has lost significant money, he reports he has issues with being able to see his kids and just found out that he is a grandparent this week. Pt told [employer] he is going to commit suicide on his [birth year]. He told TTS [facility], but refused to divulge his plan. He told TTS that "we can't keep him forever" and he will be able to kill himself when released. Pt appears to have plan and intent. Pt denies HI, AV. No prior psych treatment reported. Pt reports daily use of alcohol, 2 beers per day.   Feliz Estrada was admitted to the [facility] for worsening symptoms of depression & crisis management due to suicide threats with an unspecific plan/intent. He states that his problems were triggered by recent death of his father, gambling problems & inability to see his children.  After his admission assessment, his presenting symptoms were identified. The medication regimen targeting those symptoms were initiated. Feliz Estrada was medicated & discharged on; Citalopram 10 mg for depression, Hydroxyzine 25 mg for anxiety & Naltrexone 50 mg for alcoholism. He presented no other significant pre-existing medical problems that required treatment. He tolerated his treatment regimen without any adverse effects or reactions. Feliz Estrada was enrolled & participated in the group counseling sessions being offered & held on this unit. He learned coping skills..  During the course  of his hospitalization, his mprovement was monitored by observation & his daily report of symptom reduction.  Emotional & mental status were monitored by daily self-inventory reports completed by [name] & the clinical staff. He was evaluated by the treatment team for mood stability & plans for continued recovery after discharge. Caleb Estrada's motivation was an integral factor in his mood stability. He was offered further  treatment options upon discharge to maintain mood stability but he declined.   Upon discharge, Caleb Estrada was both mentally and medically stable. He denies suicidal/homicidal ideations, auditory/visual/tactile hallucinations, delusional thoughts or paranoia. He received from the pharmacy, a 7 days worth, supply samples of his [facility] discharge medications. He left [facility] with all belongings in no distress. Transportation per son.  Physical Findings: AIMS: Facial and Oral Movements Muscles of Facial Expression: None, normal Lips and Perioral Area: None, normal Jaw: None, normal Tongue: None, normal,Extremity Movements Upper (arms, wrists, hands, fingers): None, normal Lower (legs, knees, ankles, toes): None, normal, Trunk Movements Neck, shoulders, hips: None, normal, Overall Severity Severity of abnormal movements (highest score from questions above): None, normal Incapacitation due to abnormal movements: None, normal Patient's awareness of abnormal movements (rate only patient's report): No Awareness, Dental Status Current problems with teeth and/or dentures?: No Does patient usually wear dentures?: No  CIWA:  CIWA-Ar Total: 1 COWS:  COWS Total Score: 1  Musculoskeletal: Strength & Muscle Tone: within normal limits Gait & Station: normal Patient leans: N/A  Psychiatric Specialty Exam: Review of Systems  Constitutional: Negative.   HENT: Negative.   Eyes: Negative.   Respiratory: Negative.   Cardiovascular: Negative.   Gastrointestinal: Negative.   Genitourinary: Negative.   Musculoskeletal: Negative.   Skin: Negative.   Neurological: Negative.   Endo/Heme/Allergies: Negative.   Psychiatric/Behavioral: Positive for substance abuse (Alcohol & Tobacco use disorder  ). Negative for suicidal ideas, hallucinations and memory loss. Depression: Stable. The patient has insomnia (Stable). The patient is not nervous/anxious.     Blood pressure 123/77, pulse 56, temperature 97.7 F (36.5 C),  temperature source Oral, resp. rate 14, height  (1.702 m), weight 61.689 kg (136 lb).Body mass index is 21.3 kg/(m^2).  See Md's SRA   Have you used any form of tobacco in the last 30 days? (Cigarettes, Smokeless Tobacco, Cigars, and/or Pipes): Yes  Has this patient used any form of tobacco in the last 30 days? (Cigarettes, Smokeless Tobacco, Cigars, and/or Pipes) Yes, Yes, A prescription for an FDA-approved tobacco cessation medication was offered at discharge and the patient refused  Metabolic Disorder Labs:  No results found for: HGBA1C, MPG No results found for: PROLACTIN No results found for: CHOL, TRIG, HDL, CHOLHDL, VLDL, LDLCALC  See Psychiatric Specialty Exam and Suicide Risk Assessment completed by Attending Physician prior to discharge.  Discharge destination:  Home  Is patient on multiple antipsychotic therapies at discharge:  No   Has Patient had three or more failed trials of antipsychotic monotherapy by history:  No  Recommended Plan for Multiple Antipsychotic Therapies: NA    Medication List    TAKE these medications      Indication   citalopram 10 MG tablet  Commonly known as:  CELEXA  Take 1 tablet (10 mg total) by mouth daily. For depression   Indication:  Depression     hydrOXYzine 25 MG tablet  Commonly known as:  ATARAX/VISTARIL  Take 1 tablet (25 mg total) by mouth 3 (three) times daily as needed for anxiety (sleep).   Indication:  Anxiety/sleep  naltrexone 50 MG tablet  Commonly known as:  DEPADE  Take 1 tablet (50 mg total) by mouth daily. For alcoholism   Indication:  Excessive Use of Alcohol     nicotine 21 mg/24hr patch  Commonly known as:  NICODERM CQ - dosed in mg/24 hours  Place 1 patch (21 mg total) onto the skin daily. For smoking cessation   Indication:  Nicotine Addiction       Follow-up Information    Follow up with Refused.   Contact information:   Patient refuses follow-up.     Follow-up recommendations: Activity:  As  tolerated Diet: As recommended by your primary care doctor. Keep all scheduled follow-up appointments as recommended.   Comments: Take all your medications as prescribed by your mental healthcare provider. Report any adverse effects and or reactions from your medicines to your outpatient provider promptly. Patient is instructed and cautioned to not engage in alcohol and or illegal drug use while on prescription medicines. In the event of worsening symptoms, patient is instructed to call the crisis hotline, 911 and or go to the nearest ED for appropriate evaluation and treatment of symptoms. Follow-up with your primary care provider for your other medical issues, concerns and or health care needs.    Signed: Sanjuana Kava, PMHNP, FNP-BC 05/22/2015, 3:11 PM  Patient seen, Suicide Assessment Completed.  Disposition Plan Reviewed

## 2015-05-21 NOTE — BHH Suicide Risk Assessment (Signed)
[facility] Discharge Suicide Risk Assessment   Demographic Factors:  44 year old divorced male, employed   Total Time spent with patient: 30 minutes  Musculoskeletal: Strength & Muscle Tone: within normal limits Gait & Station: normal Patient leans: N/A  Psychiatric Specialty Exam: Physical Exam  ROS  Blood pressure 129/85, pulse 61, temperature 97.7 F (36.5 C), temperature source Oral, resp. rate 14, height 5\' 7"  (1.702 m), weight 136 lb (61.689 kg).Body mass index is 21.3 kg/(m^2).  General Appearance: improved grooming   Eye Contact::  Good  Speech:  Normal Rate409  Volume:  Normal  Mood:  Euthymic  Affect:  Appropriate and Full Range  Thought Process:  Linear  Orientation:  Full (Time, Place, and Person)  Thought Content:  no hallucinations , no delusions   Suicidal Thoughts:  No  Homicidal Thoughts:  No  Memory:  recent and remote grossly intact   Judgement:  Other:  improved   Insight:  improved  Psychomotor Activity:  Normal  Concentration:  Good  Recall:  Good  Fund of Knowledge:Good  Language: Good  Akathisia:  Negative  Handed:  Right  AIMS (if indicated):     Assets:  Communication Skills Desire for Improvement Resilience  Sleep:  Number of Hours: 4.5  Cognition: WNL  ADL's:  Intact   Have you used any form of tobacco in the last 30 days? (Cigarettes, Smokeless Tobacco, Cigars, and/or Pipes): Yes  Has this patient used any form of tobacco in the last 30 days? 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 * Of note, states he is more insightful about his gambling problem and spoke with his adult daughters who are now going to control patient's monies, with his consent, to make it easier for him to abstain from gambling . * Denies medication side effects- on Naltrexone to help with pathological gambling history- side effects, opiate blocking properties have been discussed   Cognitive Features That Contribute To Risk:  No gross cognitive deficits noted upon discharge. Is alert , attentive, and oriented x 3   Suicide Risk:  Mild:  Suicidal ideation of limited frequency, intensity, duration, and specificity.  There are no identifiable plans, no associated intent, mild dysphoria and related symptoms, good self-control (both objective and subjective assessment), few other risk factors, and identifiable protective factors, including available and accessible social support.  Principal Problem: MDD (major depressive disorder), recurrent episode, moderate (HCC) Discharge Diagnoses:  Patient Active Problem List   Diagnosis Date Noted  . MDD (major depressive disorder), recurrent episode, moderate (HCC) [F33.1] 05/17/2015  . Alcohol use disorder, mild, abuse [F10.10] 05/17/2015  . Tobacco use disorder [F17.200] 05/17/2015  . Hypokalemia [E87.6] 05/17/2015    Follow-up Information    Follow up with Refused.   Contact  information:   Patient refuses follow-up.      Plan Of Care/Follow-up recommendations:  Activity:  as tolerated Diet:  regular Tests:  NA Other:  see below  Is patient on multiple antipsychotic therapies at discharge:  No    Has Patient had three or more failed trials of antipsychotic monotherapy by history:  No  Recommended Plan for Multiple Antipsychotic Therapies: NA  Patient is leaving unit in good spirits Plans to return home. States he plans to get a PCP. Patient states PCP will manage currently prescribed medications .  COBOS, FERNANDO 05/21/2015, 1:02 PM

## 2015-05-21 NOTE — B[facility] Group Notes (Signed)
[facility] LCSW Aftercare Discharge Planning Group Note  05/21/2015 8:45 AM  Participation Quality: Alert, Appropriate and Oriented  Pt did not attend, declined invitation.   Chad CordialLauren Carter, LCSWA 05/21/2015 10:21 AM

## 2015-05-21 NOTE — Tx Team (Signed)
Interdisciplinary Treatment Plan Update (Adult) Date: 05/21/2015   Date: 05/21/2015 12:32 PM  Progress in Treatment:  Attending groups: Yes, intermittently Participating in groups: Yes, minimally Taking medication as prescribed: Yes  Tolerating medication: Yes  Family/Significant othe contact made: No, Pt declines Patient understands diagnosis: Yes  Discussing patient identified problems/goals with staff: Yes  Medical problems stabilized or resolved: Yes  Denies suicidal/homicidal ideation: Yes  Patient has not harmed self or Others: Yes   New problem(s) identified: None identified at this time.   Discharge Plan or Barriers: CSW will assess for appropriate discharge plan and relevant barriers.   05/21/15: Pt will return home; declines aftercare referrals  Additional comments:  Patient and CSW reviewed pt's identified goals and treatment plan. Patient verbalized understanding and agreed to treatment plan. CSW reviewed [facility] "Discharge Process and Patient Involvement" Form. Pt verbalized understanding of information provided and signed form.   Reason for Continuation of Hospitalization:  Depression Medication stabilization Suicidal ideation Withdrawal symptoms   Estimated length of stay: 0 days; Pt stable for DC  Review of initial/current patient goals per problem list:   1.  Goal(s): Patient will participate in aftercare plan  Met:  Yes  Target date: 3-5 days from date of admission   As evidenced by: Patient will participate within aftercare plan AEB aftercare provider and housing plan at discharge being identified.  05/20/15: CSW to work with Pt to assess for appropriate discharge plan and faciliate appointments and referrals as needed prior to d/c. 05/21/15: Pt will return home; declines follow-up appointments  2.  Goal (s): Patient will exhibit decreased depressive symptoms and suicidal ideations.  Met:  Adequate for DC  Target date: 3-5 days from date of admission    As evidenced by: Patient will utilize self rating of depression at 3 or below and demonstrate decreased signs of depression or be deemed stable for discharge by MD.  05/20/15: Pt does not participate in programming; observed to be irritable with flat affect; denies SI  05/21/15: MD feels that Pt's symptoms have decreased to the point that they can be managed in an outpatient setting.  4.  Goal(s): Patient will demonstrate decreased signs of withdrawal due to substance abuse  Met:  Yes  Target date: 3-5 days from date of admission   As evidenced by: Patient will produce a CIWA/COWS score of 0, have stable vitals signs, and no symptoms of withdrawal  05/20/15: Pt CIWA score of 0. Pt denies symptoms of withdrawal. Attendees:  Patient:    Family:    Physician: Dr. Parke Poisson, MD  05/21/2015 12:32 PM  Nursing: Lars Pinks, RN Case manager  05/21/2015 12:32 PM  Clinical Social Worker Peri Maris, [place] 05/21/2015 12:32 PM  Other: Tilden Fossa, [place] 05/21/2015 12:32 PM  Clinical: Grayland Ormond, RN;  05/21/2015 12:32 PM  Other: , RN Charge Nurse 05/21/2015 12:32 PM  Other:    Peri Maris, [place] Social Work [phone]

## 2015-05-21 NOTE — Progress Notes (Signed)
Discharge Note:  Patient's family picked him up to take home.  Patient denied SI and HI.  Denied A/V hallucinations.  Suicide prevention information given and discussed with patient who stated he understood and had no questions.  Patient stated he received all his belongings, clothing, toiletries, misc items, medications, prescriptions, sneakers, socks, hoodie, lighter, wallet, cards, keys, etc.  Patient stated he appreciated all assistance [facility].

## 2015-05-21 NOTE — Progress Notes (Addendum)
  [facility] Discharge [facility] :  Will you be returning to the same living situation after discharge:  Yes,  Pt returning home At discharge, do you have transportation home?: Yes,  Pt son to provide transportation Do you have the ability to pay for your medications: Yes,  Pt provided with 7-day supply and prescriptions  Release of information consent forms completed and in the chart;  Patient's signature needed at discharge.  Patient to Follow up at: Follow-up Information    Follow up with Refused.   Contact information:   Patient refuses follow-up.      Next level of care provider has access to [facility] Link:no  Safety Planning and Suicide Prevention discussed: Yes,  with Pt; declines family contact  Have you used any form of tobacco in the last 30 days? (Cigarettes, Smokeless Tobacco, Cigars, and/or Pipes): Yes  Has patient been referred to the Quitline?: Patient refused referral  Patient has been referred for addiction treatment: Pt. refused referral  Caleb Estrada, Caleb Estrada 05/21/2015, 12:39 PM

## 2015-05-21 NOTE — Progress Notes (Signed)
D:  Patient's self inventory sheet, patient stated he had fair sleep last night, no sleep medication given.  Fair appetite, normal energy level, good concentration.  Denied depression, anxiety and hopeless.  Denied withdrawals.  Denied SI.  Denied physical problems.  Denied pain.  Goal is to discharge.  Wants to be released.  No discharge plans. A:  Medications administered per MD orders.  Emotional support and encouragement given patient. R:  Patient denied SI and HI, contracts for safety.  Denied A/V hallucinations.  Safety maintained with 15 minute checks.

## 2022-11-05 ENCOUNTER — Other Ambulatory Visit (HOSPITAL_COMMUNITY): Payer: Self-pay | Admitting: Orthopedic Surgery

## 2022-11-05 DIAGNOSIS — M459 Ankylosing spondylitis of unspecified sites in spine: Secondary | ICD-10-CM

## 2022-11-15 ENCOUNTER — Ambulatory Visit (HOSPITAL_COMMUNITY)
Admission: RE | Admit: 2022-11-15 | Discharge: 2022-11-15 | Disposition: A | Discharge status: Home / Self Care | Payer: BC Managed Care – PPO | Source: Ambulatory Visit | Attending: Orthopedic Surgery | Admitting: Orthopedic Surgery

## 2022-11-15 DIAGNOSIS — M459 Ankylosing spondylitis of unspecified sites in spine: Secondary | ICD-10-CM | POA: Diagnosis present

## 2022-12-02 ENCOUNTER — Ambulatory Visit (HOSPITAL_COMMUNITY): Payer: Self-pay
